# Patient Record
Sex: Female | Born: 1970 | Race: White | Hispanic: No | Marital: Married | Smoking: Light tobacco smoker
Health system: Southern US, Community
[De-identification: ages and names within clinical notes are randomized; demographics above are authoritative.]

## PROBLEM LIST (undated history)

## (undated) DIAGNOSIS — E538 Deficiency of other specified B group vitamins: Secondary | ICD-10-CM

## (undated) DIAGNOSIS — E039 Hypothyroidism, unspecified: Secondary | ICD-10-CM

## (undated) DIAGNOSIS — F419 Anxiety disorder, unspecified: Secondary | ICD-10-CM

## (undated) DIAGNOSIS — E559 Vitamin D deficiency, unspecified: Secondary | ICD-10-CM

## (undated) HISTORY — PX: HERNIA REPAIR: SHX51

---

## 2009-01-12 ENCOUNTER — Ambulatory Visit: Payer: Self-pay

## 2009-10-19 ENCOUNTER — Ambulatory Visit: Payer: Self-pay | Admitting: Internal Medicine

## 2011-12-30 ENCOUNTER — Ambulatory Visit: Payer: Self-pay

## 2011-12-30 LAB — RAPID INFLUENZA A&B ANTIGENS

## 2012-06-25 ENCOUNTER — Ambulatory Visit: Payer: Self-pay | Admitting: Emergency Medicine

## 2012-10-22 ENCOUNTER — Ambulatory Visit: Payer: Self-pay | Admitting: Medical

## 2013-02-10 ENCOUNTER — Ambulatory Visit: Payer: Self-pay | Admitting: Family Medicine

## 2013-08-19 DIAGNOSIS — Z9109 Other allergy status, other than to drugs and biological substances: Secondary | ICD-10-CM | POA: Insufficient documentation

## 2013-08-26 ENCOUNTER — Emergency Department: Payer: Self-pay | Admitting: Emergency Medicine

## 2013-09-21 ENCOUNTER — Ambulatory Visit: Payer: Self-pay

## 2014-03-06 ENCOUNTER — Ambulatory Visit: Payer: Self-pay | Admitting: Emergency Medicine

## 2014-03-06 LAB — RAPID STREP-A WITH REFLX: Micro Text Report: POSITIVE

## 2014-10-30 ENCOUNTER — Ambulatory Visit: Payer: Self-pay | Admitting: Physician Assistant

## 2015-12-29 DIAGNOSIS — E789 Disorder of lipoprotein metabolism, unspecified: Secondary | ICD-10-CM | POA: Insufficient documentation

## 2017-08-30 ENCOUNTER — Other Ambulatory Visit: Payer: Self-pay | Admitting: Obstetrics & Gynecology

## 2017-08-30 DIAGNOSIS — Z1231 Encounter for screening mammogram for malignant neoplasm of breast: Secondary | ICD-10-CM

## 2017-09-13 ENCOUNTER — Ambulatory Visit
Admission: RE | Admit: 2017-09-13 | Discharge: 2017-09-13 | Disposition: A | Discharge status: Home / Self Care | Payer: Managed Care, Other (non HMO) | Source: Ambulatory Visit | Attending: Obstetrics & Gynecology | Admitting: Obstetrics & Gynecology

## 2017-09-13 ENCOUNTER — Encounter: Payer: Self-pay | Admitting: Radiology

## 2017-09-13 DIAGNOSIS — Z1231 Encounter for screening mammogram for malignant neoplasm of breast: Secondary | ICD-10-CM | POA: Diagnosis present

## 2017-09-14 ENCOUNTER — Other Ambulatory Visit: Payer: Self-pay | Admitting: *Deleted

## 2017-09-14 ENCOUNTER — Inpatient Hospital Stay
Admission: RE | Admit: 2017-09-14 | Discharge: 2017-09-14 | Disposition: A | Discharge status: Home / Self Care | Payer: Self-pay | Source: Ambulatory Visit | Attending: *Deleted | Admitting: *Deleted

## 2017-09-14 DIAGNOSIS — Z9289 Personal history of other medical treatment: Secondary | ICD-10-CM

## 2019-02-01 ENCOUNTER — Ambulatory Visit: Payer: Managed Care, Other (non HMO) | Admitting: Podiatry

## 2019-02-08 ENCOUNTER — Encounter: Payer: Self-pay | Admitting: Podiatry

## 2019-02-08 ENCOUNTER — Ambulatory Visit: Payer: Managed Care, Other (non HMO) | Admitting: Podiatry

## 2019-02-08 ENCOUNTER — Ambulatory Visit (INDEPENDENT_AMBULATORY_CARE_PROVIDER_SITE_OTHER): Payer: Managed Care, Other (non HMO)

## 2019-02-08 VITALS — BP 113/69 | HR 80

## 2019-02-08 DIAGNOSIS — M21612 Bunion of left foot: Secondary | ICD-10-CM

## 2019-02-08 DIAGNOSIS — M79672 Pain in left foot: Secondary | ICD-10-CM

## 2019-02-08 DIAGNOSIS — M21611 Bunion of right foot: Secondary | ICD-10-CM | POA: Diagnosis not present

## 2019-02-08 DIAGNOSIS — M79671 Pain in right foot: Secondary | ICD-10-CM

## 2019-02-08 DIAGNOSIS — M2011 Hallux valgus (acquired), right foot: Secondary | ICD-10-CM | POA: Diagnosis not present

## 2019-02-08 DIAGNOSIS — M2010 Hallux valgus (acquired), unspecified foot: Secondary | ICD-10-CM

## 2019-02-08 NOTE — Patient Instructions (Signed)
Pre-Operative Instructions  Congratulations, you have decided to take an important step towards improving your quality of life.  You can be assured that the doctors and staff at [facility] will be with you every step of the way.  Here are some important things you should know:  1. Plan to be at the surgery center/hospital at least 1 (one) hour prior to your scheduled time, unless otherwise directed by the surgical center/hospital staff.  You must have a responsible adult accompany you, remain during the surgery and drive you home.  Make sure you have directions to the surgical center/hospital to ensure you arrive on time. 2. If you are having surgery at [facility] or [facility], you will need a copy of your medical history and physical form from your family physician within one month prior to the date of surgery. We will give you a form for your primary physician to complete.  3. We make every effort to accommodate the date you request for surgery.  However, there are times where surgery dates or times have to be moved.  We will contact you as soon as possible if a change in schedule is required.   4. No aspirin/ibuprofen for one week before surgery.  If you are on aspirin, any non-steroidal anti-inflammatory medications (Mobic, Aleve, Ibuprofen) should not be taken seven (7) days prior to your surgery.  You make take Tylenol for pain prior to surgery.  5. Medications - If you are taking daily heart and blood pressure medications, seizure, reflux, allergy, asthma, anxiety, pain or diabetes medications, make sure you notify the surgery center/hospital before the day of surgery so they can tell you which medications you should take or avoid the day of surgery. 6. No food or drink after midnight the night before surgery unless directed otherwise by surgical center/hospital staff. 7. No alcoholic beverages 24-hours prior to surgery.  No smoking 24-hours prior or 24-hours after  surgery. 8. Wear loose pants or shorts. They should be loose enough to fit over bandages, boots, and casts. 9. Don't wear slip-on shoes. Sneakers are preferred. 10. Bring your boot with you to the surgery center/hospital.  Also bring crutches or a walker if your physician has prescribed it for you.  If you do not have this equipment, it will be provided for you after surgery. 11. If you have not been contacted by the surgery center/hospital by the day before your surgery, call to confirm the date and time of your surgery. 12. Leave-time from work may vary depending on the type of surgery you have.  Appropriate arrangements should be made prior to surgery with your employer. 13. Prescriptions will be provided immediately following surgery by your doctor.  Fill these as soon as possible after surgery and take the medication as directed. Pain medications will not be refilled on weekends and must be approved by the doctor. 14. Remove nail polish on the operative foot and avoid getting pedicures prior to surgery. 15. Wash the night before surgery.  The night before surgery wash the foot and leg well with water and the antibacterial soap provided. Be sure to pay special attention to beneath the toenails and in between the toes.  Wash for at least three (3) minutes. Rinse thoroughly with water and dry well with a towel.  Perform this wash unless told not to do so by your physician.  Enclosed: 1 Ice pack (please put in freezer the night before surgery)   1 Hibiclens skin cleaner     Pre-op instructions  If you have any questions regarding the instructions, please do not hesitate to call our office.  [place]: [address], [place], [place] [address] -- [phone]  [place]: [address] [place], [place] [address] -- [phone]  [place]: [address]  [place], [place] [address] -- [phone]  [place]: [address], [address], [place], [place] [address] -- [phone]  Website:  [email or web address] 

## 2019-02-11 NOTE — Progress Notes (Signed)
   Subjective: 48 year old female presenting today as a new patient with a chief complaint of painful bunions noted to bilateral feet that have been present for the past several years. She states the pain has been gradually worsening over the past few months. Walking and wearing shoes increases the pain. She has been taking OTC pain medications with no significant relief. Patient is here for further evaluation and treatment.  History reviewed. No pertinent past medical history.    Objective: Physical Exam General: The patient is alert and oriented x3 in no acute distress.  Dermatology: Skin is cool, dry and supple bilateral lower extremities. Negative for open lesions or macerations.  Vascular: Palpable pedal pulses bilaterally. No edema or erythema noted. Capillary refill within normal limits.  Neurological: Epicritic and protective threshold grossly intact bilaterally.   Musculoskeletal Exam: Clinical evidence of bunion deformity noted to the respective foot. There is moderate pain on palpation range of motion of the first MPJ. Lateral deviation of the hallux noted consistent with hallux abductovalgus.  Radiographic Exam: Increased intermetatarsal angle greater than 15 with a hallux abductus angle greater than 30 noted on AP view. Moderate degenerative changes noted within the first MPJ.  Assessment: 1. HAV w/ bunion deformity bilateral     Plan of Care:  1. Patient was evaluated. X-Rays reviewed. 2. Today we discussed the conservative versus surgical management of the presenting pathology. The patient opts for surgical management. All possible complications and details of the procedure were explained. All patient questions were answered. No guarantees were expressed or implied. 3. Authorization for surgery was initiated today. Surgery will consist of bunionectomy with 1st metatarsal osteotomy left.  4. Return to clinic one week post op.   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      Edrick Kins, DPM [facility]  Dr. Edrick Kins, [place]                                        [place], [place] [address]                Office [phone]  Fax [phone]

## 2019-02-25 ENCOUNTER — Other Ambulatory Visit: Payer: Self-pay

## 2019-02-25 ENCOUNTER — Ambulatory Visit
Admission: EM | Admit: 2019-02-25 | Discharge: 2019-02-25 | Disposition: A | Discharge status: Home / Self Care | Payer: Managed Care, Other (non HMO) | Attending: Family Medicine | Admitting: Family Medicine

## 2019-02-25 ENCOUNTER — Encounter: Payer: Self-pay | Admitting: Emergency Medicine

## 2019-02-25 DIAGNOSIS — J111 Influenza due to unidentified influenza virus with other respiratory manifestations: Secondary | ICD-10-CM

## 2019-02-25 DIAGNOSIS — R509 Fever, unspecified: Secondary | ICD-10-CM | POA: Diagnosis not present

## 2019-02-25 DIAGNOSIS — R05 Cough: Secondary | ICD-10-CM

## 2019-02-25 DIAGNOSIS — F17210 Nicotine dependence, cigarettes, uncomplicated: Secondary | ICD-10-CM

## 2019-02-25 DIAGNOSIS — R0981 Nasal congestion: Secondary | ICD-10-CM

## 2019-02-25 DIAGNOSIS — R0602 Shortness of breath: Secondary | ICD-10-CM

## 2019-02-25 HISTORY — DX: Vitamin D deficiency, unspecified: E55.9

## 2019-02-25 HISTORY — DX: Deficiency of other specified B group vitamins: E53.8

## 2019-02-25 HISTORY — DX: Anxiety disorder, unspecified: F41.9

## 2019-02-25 MED ORDER — OSELTAMIVIR PHOSPHATE 75 MG PO CAPS: 75.0000 mg | ORAL_CAPSULE | Freq: Two times a day (BID) | ORAL | 0 refills | Status: DC

## 2019-02-25 MED ORDER — HYDROCOD POLST-CPM POLST ER 10-8 MG/5ML PO SUER: 5.0000 mL | Freq: Two times a day (BID) | ORAL | 0 refills | Status: DC | PRN

## 2019-02-25 MED ORDER — ALBUTEROL SULFATE HFA 108 (90 BASE) MCG/ACT IN AERS: 1.0000 | INHALATION_SPRAY | Freq: Four times a day (QID) | RESPIRATORY_TRACT | 0 refills | Status: DC | PRN

## 2019-02-25 NOTE — Discharge Instructions (Signed)
Rest. Fluids.  Use the inhaler for cough and SOB.  Tussionex for cough.  Tamiflu as directed.  Take care  Dr. Lacinda Axon

## 2019-02-25 NOTE — ED Triage Notes (Signed)
Patient in today c/o cough, body aches, and has felt feverish since yesterday. Patient has been taking Ibuprofen and Mucinex.

## 2019-02-25 NOTE — ED Provider Notes (Signed)
[facility]    CSN: [id number] Arrival date & time: 02/25/19  5643  History   Chief Complaint Chief Complaint  Patient presents with  . Cough  . Generalized Body Aches   HPI  48 year old female presents with fever, cough, congestion.  Started abruptly yesterday.  Patient states that she has felt fevers.  She states that she had "sweats" last night.  She has not taken her temperature.  She reports chest tightness, cough, and nasal congestion/sinus congestion.  Patient reports associated body aches as well.  Symptoms are severe.  She has been using Mucinex, vitamin C, and Robitussin without resolution.  Has not really helped any of her symptoms.  No known exacerbating factors.  Patient does report that her symptoms are disrupting her sleep.  No other associated symptoms.  No complaints.  PMH, Surgical Hx, Family Hx, Social History reviewed and updated as below.  Past Medical History:  Diagnosis Date  . Anxiety   . Vitamin B12 deficiency   . Vitamin D deficiency     Patient Active Problem List   Diagnosis Date Noted  . Borderline high cholesterol 12/29/2015  . Environmental allergies 08/19/2013    Past Surgical History:  Procedure Laterality Date  . CESAREAN SECTION    . HERNIA REPAIR      OB History   No obstetric history on file.      Home Medications    Prior to Admission medications   Medication Sig Start Date End Date Taking? Authorizing Provider  Cyanocobalamin (VITAMIN B12 PO) Take by mouth.   Yes [provider]  escitalopram (LEXAPRO) 10 MG tablet Take by mouth. 10/04/18  Yes [provider]  levonorgestrel (MIRENA) 20 MCG/24HR IUD by Intrauterine route.   Yes [provider]  Multiple Vitamin (MULTI-VITAMINS) TABS Take by mouth.   Yes [provider]  Vitamin D, Ergocalciferol, (DRISDOL) 1.25 MG (50000 UT) CAPS capsule Take by mouth. 09/04/17  Yes [provider]  albuterol (PROVENTIL HFA;VENTOLIN  HFA) 108 (90 Base) MCG/ACT inhaler Inhale 1-2 puffs into the lungs every 6 (six) hours as needed for wheezing or shortness of breath. 02/25/19   Coral Spikes, DO  chlorpheniramine-HYDROcodone (TUSSIONEX PENNKINETIC ER) 10-8 MG/5ML SUER Take 5 mLs by mouth every 12 (twelve) hours as needed. 02/25/19   Coral Spikes, DO  oseltamivir (TAMIFLU) 75 MG capsule Take 1 capsule (75 mg total) by mouth every 12 (twelve) hours. 02/25/19   Coral Spikes, DO    Family History Family History  Problem Relation Age of Onset  . Breast cancer Mother 41  . Hyperlipidemia Mother   . Heart attack Father 58  . COPD Father   . Hypertension Father   . Hyperlipidemia Father     Social History Social History   Tobacco Use  . Smoking status: Light Tobacco Smoker  . Smokeless tobacco: Never Used  . Tobacco comment: smokes socially  Substance Use Topics  . Alcohol use: Yes    Comment: social  . Drug use: Never     Allergies   Sulfa antibiotics   Review of Systems Review of Systems  Constitutional: Positive for fever.  HENT: Positive for congestion.   Respiratory: Positive for cough and shortness of breath.   Musculoskeletal:       Body aches.   Physical Exam Triage Vital Signs ED Triage Vitals [02/25/19 0857]  Enc Vitals Group     BP 140/88     Pulse Rate 93     Resp  16     Temp 99.4 F (37.4 C)     Temp Source Oral     SpO2 98 %     Weight 188 lb (85.3 kg)     Height 5' (1.524 m)     Head Circumference      Peak Flow      Pain Score 7     Pain Loc      Pain Edu?      Excl. in [place]?    Updated Vital Signs BP 140/88 (BP Location: Left Arm)   Pulse 93   Temp 99.4 F (37.4 C) (Oral)   Resp 16   Ht 5' (1.524 m)   Wt 85.3 kg   SpO2 98%   BMI 36.72 kg/m   Visual Acuity Right Eye Distance:   Left Eye Distance:   Bilateral Distance:    Right Eye Near:   Left Eye Near:    Bilateral Near:     Physical Exam Vitals signs and nursing note reviewed.  Constitutional:      General:  She is not in acute distress.    Appearance: Normal appearance.  HENT:     Head: Normocephalic and atraumatic.     Right Ear: Tympanic membrane normal.     Left Ear: Tympanic membrane normal.     Mouth/Throat:     Pharynx: Oropharynx is clear. No oropharyngeal exudate.  Eyes:     General:        Right eye: No discharge.        Left eye: No discharge.     Conjunctiva/sclera: Conjunctivae normal.  Cardiovascular:     Rate and Rhythm: Normal rate and regular rhythm.  Pulmonary:     Effort: Pulmonary effort is normal.     Breath sounds: Normal breath sounds.  Neurological:     Mental Status: She is alert.  Psychiatric:        Mood and Affect: Mood normal.        Behavior: Behavior normal.    UC Treatments / Results  Labs (all labs ordered are listed, but only abnormal results are displayed) Labs Reviewed - No data to display  EKG None  Radiology No results found.  Procedures Procedures (including critical care time)  Medications Ordered in UC Medications - No data to display  Initial Impression / Assessment and Plan / UC Course  I have reviewed the triage vital signs and the nursing notes.  Pertinent labs & imaging results that were available during my care of the patient were reviewed by me and considered in my medical decision making (see chart for details).    48 year old female presents with suspected influenza. Treating with Tamiflu.  Albuterol and Tussionex for cough/shortness of breath.  Final Clinical Impressions(s) / UC Diagnoses   Final diagnoses:  Influenza     Discharge Instructions     Rest. Fluids.  Use the inhaler for cough and SOB.  Tussionex for cough.  Tamiflu as directed.  Take care  Dr. Lacinda Axon    ED Prescriptions    Medication Sig Dispense Auth. Provider   albuterol (PROVENTIL HFA;VENTOLIN HFA) 108 (90 Base) MCG/ACT inhaler Inhale 1-2 puffs into the lungs every 6 (six) hours as needed for wheezing or shortness of breath. 1  Inhaler Manzanita, [place] G, DO   chlorpheniramine-HYDROcodone (TUSSIONEX PENNKINETIC ER) 10-8 MG/5ML SUER Take 5 mLs by mouth every 12 (twelve) hours as needed. 60 mL Birl Lobello G, DO   oseltamivir (TAMIFLU) 75 MG  capsule Take 1 capsule (75 mg total) by mouth every 12 (twelve) hours. 10 capsule Coral Spikes, DO     Controlled Substance Prescriptions [place] Controlled Substance Registry consulted? Not Applicable   Coral Spikes, [place] 02/25/19 4090

## 2019-03-19 ENCOUNTER — Other Ambulatory Visit: Payer: Self-pay | Admitting: Family Medicine

## 2019-06-17 ENCOUNTER — Other Ambulatory Visit: Payer: Self-pay | Admitting: Obstetrics & Gynecology

## 2019-06-17 DIAGNOSIS — K439 Ventral hernia without obstruction or gangrene: Secondary | ICD-10-CM

## 2019-06-17 DIAGNOSIS — Z30432 Encounter for removal of intrauterine contraceptive device: Secondary | ICD-10-CM

## 2019-06-25 ENCOUNTER — Ambulatory Visit
Admission: RE | Admit: 2019-06-25 | Discharge: 2019-06-25 | Disposition: A | Discharge status: Home / Self Care | Payer: Managed Care, Other (non HMO) | Source: Ambulatory Visit | Attending: Obstetrics & Gynecology | Admitting: Obstetrics & Gynecology

## 2019-06-25 ENCOUNTER — Other Ambulatory Visit: Payer: Self-pay

## 2019-06-25 DIAGNOSIS — Z30432 Encounter for removal of intrauterine contraceptive device: Secondary | ICD-10-CM | POA: Insufficient documentation

## 2019-06-25 DIAGNOSIS — K439 Ventral hernia without obstruction or gangrene: Secondary | ICD-10-CM | POA: Diagnosis not present

## 2019-06-25 MED ORDER — IOHEXOL 300 MG/ML  SOLN
100.0000 mL | Freq: Once | INTRAMUSCULAR | Status: AC | PRN
  Administered 2019-06-25: 100 mL via INTRAVENOUS

## 2020-04-23 ENCOUNTER — Ambulatory Visit: Payer: Managed Care, Other (non HMO) | Admitting: Dermatology

## 2020-06-23 ENCOUNTER — Other Ambulatory Visit: Payer: Self-pay | Admitting: Obstetrics & Gynecology

## 2020-06-23 DIAGNOSIS — Z1231 Encounter for screening mammogram for malignant neoplasm of breast: Secondary | ICD-10-CM

## 2020-06-30 ENCOUNTER — Other Ambulatory Visit: Payer: Self-pay

## 2020-06-30 ENCOUNTER — Ambulatory Visit
Admission: RE | Admit: 2020-06-30 | Discharge: 2020-06-30 | Disposition: A | Discharge status: Home / Self Care | Payer: Managed Care, Other (non HMO) | Source: Ambulatory Visit | Attending: Obstetrics & Gynecology | Admitting: Obstetrics & Gynecology

## 2020-06-30 DIAGNOSIS — Z1231 Encounter for screening mammogram for malignant neoplasm of breast: Secondary | ICD-10-CM | POA: Insufficient documentation

## 2020-07-10 ENCOUNTER — Ambulatory Visit: Payer: Self-pay | Admitting: General Surgery

## 2020-07-10 NOTE — H&P (View-Only) (Signed)
PATIENT PROFILE: Betty Poole is a 49 y.o. female who presents to the Clinic for consultation at the request of Dr. Ola Spurr for evaluation of ventral hernia.  PCP:  Angelena Form, MD  HISTORY OF PRESENT ILLNESS: Betty Poole reports having a hernia for at least a year ago.  She reported that it is getting aggravating and causing pain on the midline of the abdomen.  The pain does not radiate to other part of the body.  The pain is aggravated by doing exercises like abdominal exercises.  There is no alleviating factors.  The patient reports some nausea but no vomiting.  She reports some abdominal distention after eating.  She reports that sometimes it gets as big and hard as a soft ball.  There are other days that it is soft and nontender like today.  Patient reports having umbilical hernia repair in the past.  Patient had a CT scan of the abdomen and pelvis at the [facility] epic.  I personally evaluated the images.  There is ventral hernia within diastases recti.  Hernia content during that CT was omental fat.  No bowel seen on the hernia during CT scan.   PROBLEM LIST:        Problem List  Date Reviewed: 11/27/2017       Noted   Borderline high cholesterol 12/29/2015      GENERAL REVIEW OF SYSTEMS:   General ROS: negative for - chills, fatigue, fever, weight gain or weight loss Allergy and Immunology ROS: negative for - hives  Hematological and Lymphatic ROS: negative for - bleeding problems or bruising, negative for palpable nodes Endocrine ROS: negative for - heat or cold intolerance, hair changes Respiratory ROS: negative for - cough, shortness of breath or wheezing Cardiovascular ROS: no chest pain or palpitations GI ROS: negative for nausea, vomiting, abdominal pain, diarrhea, constipation Musculoskeletal ROS: negative for - joint swelling or muscle pain Neurological ROS: negative for - confusion, syncope Dermatological ROS: negative for pruritus and  rash Psychiatric: negative for anxiety, depression, difficulty sleeping and memory loss  MEDICATIONS: Current Medications        Current Outpatient Medications  Medication Sig Dispense Refill  . ergocalciferol, vitamin D2, 1,250 mcg (50,000 unit) capsule Take 1 capsule (50,000 Units total) by mouth once a week 12 capsule 1  . escitalopram oxalate (LEXAPRO) 10 MG tablet Take 1 tablet (10 mg total) by mouth once daily NEEDS APPT. PRIOR TO FURTHER REFILLS 30 tablet 1  . fluticasone propionate (FLONASE) 50 mcg/actuation nasal spray Place 2 sprays into both nostrils once daily 16 g 0  . levothyroxine (SYNTHROID) 25 MCG tablet Take 1 tablet (25 mcg total) by mouth once daily Take on an empty stomach with a glass of water at least 30-60 minutes before breakfast. 30 tablet 3  . LORazepam (ATIVAN) 0.5 MG tablet Take 0.5 mg by mouth every 8 (eight) hours as needed for Anxiety     No current facility-administered medications for this visit.      ALLERGIES: Aspirin and Sulfa (sulfonamide antibiotics)  PAST MEDICAL HISTORY:     Past Medical History:  Diagnosis Date  . Anxiety   . Borderline high cholesterol 12/29/2015  . Heart murmur, unspecified At birth only  . Hypertension in pregnancy, preeclampsia, delivered    Only high with pregnancy  . Hypothyroidism (acquired), unspecified 07/2017   started on synthroid 25 mcg  . Vitamin B12 deficiency   . Vitamin D deficiency disease 08/2017    PAST SURGICAL HISTORY:  Past Surgical History:  Procedure Laterality Date  . CESAREAN SECTION     Delivered twins  . hernia repair  2017   Double hernia repair  . HERNIA REPAIR       FAMILY HISTORY:      Family History  Problem Relation Age of Onset  . Alcohol abuse Mother   . Breast cancer Mother   . Depression Mother   . Coronary Artery Disease (Blocked arteries around heart) Father   . High blood pressure (Hypertension) Father   . Hyperlipidemia (Elevated  cholesterol) Father   . Myocardial Infarction (Heart attack) Father   . Heart disease Father   . Alzheimer's disease Maternal Grandmother   . Heart failure Maternal Grandmother   . Stroke Paternal Grandmother   . High blood pressure (Hypertension) Paternal Grandmother   . Coronary Artery Disease (Blocked arteries around heart) Paternal Grandmother   . Coronary Artery Disease (Blocked arteries around heart) Paternal Grandfather   . High blood pressure (Hypertension) Paternal Grandfather   . Stroke Paternal Grandfather   . Brain cancer Paternal Grandfather   . Asthma Daughter   . High blood pressure (Hypertension) Brother   . Heart disease Brother      SOCIAL HISTORY: Social History          Socioeconomic History  . Marital status: Married    Spouse name: Maddilyn Campus  . Number of children: 2  . Years of education: 10  . Highest education level: Associate degree: academic program  Occupational History  . Occupation: Chief Strategy Officer    Comment: [employer]  Tobacco Use  . Smoking status: Current Every Day Smoker    Packs/day: 0.25    Years: 0.00    Pack years: 0.00    Types: Cigarettes    Start date: 12/29/1995  . Smokeless tobacco: Never Used  Vaping Use  . Vaping Use: Never used  Substance and Sexual Activity  . Alcohol use: Yes  . Drug use: Never  . Sexual activity: Yes    Partners: Male    Birth control/protection: None  Other Topics Concern  . 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  Lives with her husband and twin daughters.   Social Determinants of Health      Financial Resource Strain:   . Difficulty of Paying Living Expenses:   Food Insecurity:   . Worried About Charity fundraiser in the Last Year:   . Arboriculturist in the Last Year:   Transportation Needs:   . Film/video editor (Medical):   Marland Kitchen Lack of Transportation (Non-Medical):        PHYSICAL EXAM:    Vitals:   07/10/20 0846  BP: 123/79  Pulse: 86   Body mass index is 37.57 kg/m. Weight: 84.4 kg (186 lb)   GENERAL: Alert, active, oriented x3  HEENT: Pupils equal reactive to light. Extraocular movements are intact. Sclera clear. Palpebral conjunctiva normal red color.Pharynx clear.  NECK: Supple with no palpable mass and no adenopathy.  LUNGS: Sound clear with no rales rhonchi or wheezes.  HEART: Regular rhythm S1 and S2 without murmur.  ABDOMEN: Soft and depressible, nontender with no palpable mass, no hepatomegaly.  Reducible small hernia in the midline.  Large diastases recti.  Nontender to palpation today  EXTREMITIES: Well-developed well-nourished symmetrical with no dependent edema.  NEUROLOGICAL: Awake alert oriented, facial expression symmetrical, moving all extremities.  REVIEW OF DATA: I have reviewed  the following data today:      Appointment on 06/26/2020  Component Date Value  . Cholesterol, Total 06/26/2020 293*  . Triglyceride 06/26/2020 372*  . HDL (High Density Lipopr* 06/26/2020 46.0   . LDL Calculated 06/26/2020 173*  . VLDL Cholesterol 06/26/2020 74   . Cholesterol/HDL Ratio 06/26/2020 6.4   . Vitamin D, 25-Hydroxy - * 06/26/2020 36.0   Office Visit on 06/22/2020  Component Date Value  . WBC (White Blood Cell Co* 06/23/2020 8.7   . RBC (Red Blood Cell Coun* 06/23/2020 4.92   . Hemoglobin 06/23/2020 14.1   . Hematocrit 06/23/2020 43.2   . MCV (Mean Corpuscular Vo* 06/23/2020 87.8   . MCH (Mean Corpuscular He* 06/23/2020 28.7   . MCHC (Mean Corpuscular H* 06/23/2020 32.6   . Platelet Count 06/23/2020 282   . RDW-CV (Red Cell Distrib* 06/23/2020 14.5   . MPV (Mean Platelet Volum* 06/23/2020 10.7   . Neutrophils 06/23/2020 4.73   . Lymphocytes 06/23/2020 3.16   . Monocytes 06/23/2020 0.54   . Eosinophils 06/23/2020 0.21   . Basophils 06/23/2020 0.05   . Neutrophil % 06/23/2020 54.3   . Lymphocyte %  06/23/2020 36.2   . Monocyte % 06/23/2020 6.2   . Eosinophil % 06/23/2020 2.4   . Basophil% 06/23/2020 0.6   . Immature Granulocyte % 06/23/2020 0.3   . Immature Granulocyte Cou* 06/23/2020 0.03   . Glucose 06/23/2020 81   . Sodium 06/23/2020 133*  . Potassium 06/23/2020 4.7   . Chloride 06/23/2020 101   . Carbon Dioxide (CO2) 06/23/2020 25.4   . Urea Nitrogen (BUN) 06/23/2020 16   . Creatinine 06/23/2020 0.8   . Glomerular Filtration Ra* 06/23/2020 77   . Calcium 06/23/2020 9.1   . AST  06/23/2020 14   . ALT  06/23/2020 17   . Alk Phos (alkaline Phosp* 06/23/2020 73   . Albumin 06/23/2020 4.0   . Bilirubin, Total 06/23/2020 0.3   . Protein, Total 06/23/2020 7.2   . A/G Ratio 06/23/2020 1.3   . Cholesterol, Total 06/23/2020 346*  . Triglyceride 06/23/2020 1,955*  . HDL (High Density Lipopr* 06/23/2020 34.9*  . Cholesterol/HDL Ratio 06/23/2020 9.9   . Thyroid Stimulating Horm* 06/23/2020 5.741*  . Vitamin B12 06/23/2020 571   . Vitamin D, 25-Hydroxy - * 06/23/2020 TNP   . DIAGNOSIS: - [facility] 06/22/2020 Comment   . Specimen adequacy: - Lab* 06/22/2020 Comment   . Clinician provided ICD10* 06/22/2020 Comment   . PERFORMED BY: - [facility] 06/22/2020 Comment   . . - [facility] 06/22/2020 .   Marland Kitchen Note: - [facility] 06/22/2020 Comment   . Test Methodology: - [facility] 06/22/2020 Comment   . HPV APTIMA - [facility] 06/22/2020 Negative      ASSESSMENT: Betty Poole is a 49 y.o. female presenting for consultation for ventral hernia.  Patient oriented about the diagnosis of ventral hernia and diastases recti.  The patient reported that the hernia is getting worse within the last months causing her more pain and unable to do some exercises.  She has been losing significant weight since she has been doing exercises.  She is also doing diet.  We have a discussion about proceeding with surgical management of hernia or waiting to continue losing weight.  She reported that he has been getting worse in the  last month so she will not think that she will be able to continue exercising as she used to.  I agree that with the symptoms it is reasonable to  proceed with surgical management at this moment.  Patient understand the high risk of recurrence with obesity.  I discussed with the patient the alternative of robotic assisted laparoscopic ventral hernia repair.  That way I will be able to examine the whole abdominal wall.  I discussed with the patient the alternative of diastases recti plication in combination with the ventral hernia.  I discussed with the patient the risks of surgery that includes: Bleeding, infection, pain, scar, injury to intestine or adjacent organs, complication of mesh, among others.  She understood and agreed to proceed.  Ventral hernia without obstruction or gangrene [K43.9]  PLAN: 1. Robotic assisted laparoscopic ventral hernia repair with mesh (09326) 2. CBC, CMP done (06/23/20) 3. Avoid taking aspirin 5 days before surgery 4. Contact us if has any question or concern.   Patient and her husband verbalized understanding, all questions were answered, and were agreeable with the plan outlined above.     Herbert Pun, MD  Electronically signed by Herbert Pun, MD

## 2020-07-10 NOTE — H&P (Signed)
PATIENT PROFILE: Betty Poole is a 49 y.o. female who presents to the Clinic for consultation at the request of Dr. Ola Spurr for evaluation of ventral hernia.  PCP:  Angelena Form, MD  HISTORY OF PRESENT ILLNESS: [name] reports having a hernia for at least a year ago.  She reported that it is getting aggravating and causing pain on the midline of the abdomen.  The pain does not radiate to other part of the body.  The pain is aggravated by doing exercises like abdominal exercises.  There is no alleviating factors.  The patient reports some nausea but no vomiting.  She reports some abdominal distention after eating.  She reports that sometimes it gets as big and hard as a soft ball.  There are other days that it is soft and nontender like today.  Patient reports having umbilical hernia repair in the past.  Patient had a CT scan of the abdomen and pelvis at the [facility] epic.  I personally evaluated the images.  There is ventral hernia within diastases recti.  Hernia content during that CT was omental fat.  No bowel seen on the hernia during CT scan.   PROBLEM LIST:        Problem List  Date Reviewed: 11/27/2017       Noted   Borderline high cholesterol 12/29/2015      GENERAL REVIEW OF SYSTEMS:   General ROS: negative for - chills, fatigue, fever, weight gain or weight loss Allergy and Immunology ROS: negative for - hives  Hematological and Lymphatic ROS: negative for - bleeding problems or bruising, negative for palpable nodes Endocrine ROS: negative for - heat or cold intolerance, hair changes Respiratory ROS: negative for - cough, shortness of breath or wheezing Cardiovascular ROS: no chest pain or palpitations GI ROS: negative for nausea, vomiting, abdominal pain, diarrhea, constipation Musculoskeletal ROS: negative for - joint swelling or muscle pain Neurological ROS: negative for - confusion, syncope Dermatological ROS: negative for pruritus and  rash Psychiatric: negative for anxiety, depression, difficulty sleeping and memory loss  MEDICATIONS: Current Medications        Current Outpatient Medications  Medication Sig Dispense Refill  . ergocalciferol, vitamin D2, 1,250 mcg (50,000 unit) capsule Take 1 capsule (50,000 Units total) by mouth once a week 12 capsule 1  . escitalopram oxalate (LEXAPRO) 10 MG tablet Take 1 tablet (10 mg total) by mouth once daily NEEDS APPT. PRIOR TO FURTHER REFILLS 30 tablet 1  . fluticasone propionate (FLONASE) 50 mcg/actuation nasal spray Place 2 sprays into both nostrils once daily 16 g 0  . levothyroxine (SYNTHROID) 25 MCG tablet Take 1 tablet (25 mcg total) by mouth once daily Take on an empty stomach with a glass of water at least 30-60 minutes before breakfast. 30 tablet 3  . LORazepam (ATIVAN) 0.5 MG tablet Take 0.5 mg by mouth every 8 (eight) hours as needed for Anxiety     No current facility-administered medications for this visit.      ALLERGIES: Aspirin and Sulfa (sulfonamide antibiotics)  PAST MEDICAL HISTORY:     Past Medical History:  Diagnosis Date  . Anxiety   . Borderline high cholesterol 12/29/2015  . Heart murmur, unspecified At birth only  . Hypertension in pregnancy, preeclampsia, delivered    Only high with pregnancy  . Hypothyroidism (acquired), unspecified 07/2017   started on synthroid 25 mcg  . Vitamin B12 deficiency   . Vitamin D deficiency disease 08/2017    PAST SURGICAL HISTORY:  Past Surgical History:  Procedure Laterality Date  . CESAREAN SECTION     Delivered twins  . hernia repair  2017   Double hernia repair  . HERNIA REPAIR       FAMILY HISTORY:      Family History  Problem Relation Age of Onset  . Alcohol abuse Mother   . Breast cancer Mother   . Depression Mother   . Coronary Artery Disease (Blocked arteries around heart) Father   . High blood pressure (Hypertension) Father   . Hyperlipidemia (Elevated  cholesterol) Father   . Myocardial Infarction (Heart attack) Father   . Heart disease Father   . Alzheimer's disease Maternal Grandmother   . Heart failure Maternal Grandmother   . Stroke Paternal Grandmother   . High blood pressure (Hypertension) Paternal Grandmother   . Coronary Artery Disease (Blocked arteries around heart) Paternal Grandmother   . Coronary Artery Disease (Blocked arteries around heart) Paternal Grandfather   . High blood pressure (Hypertension) Paternal Grandfather   . Stroke Paternal Grandfather   . Brain cancer Paternal Grandfather   . Asthma Daughter   . High blood pressure (Hypertension) Brother   . Heart disease Brother      SOCIAL HISTORY: Social History          Socioeconomic History  . Marital status: Married    Spouse name: Zanylah Hardie  . Number of children: 2  . Years of education: 47  . Highest education level: Associate degree: academic program  Occupational History  . Occupation: Chief Strategy Officer    Comment: [employer]  Tobacco Use  . Smoking status: Current Every Day Smoker    Packs/day: 0.25    Years: 0.00    Pack years: 0.00    Types: Cigarettes    Start date: 12/29/1995  . Smokeless tobacco: Never Used  Vaping Use  . Vaping Use: Never used  Substance and Sexual Activity  . Alcohol use: Yes  . Drug use: Never  . Sexual activity: Yes    Partners: Male    Birth control/protection: None  Other Topics Concern  . 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  Lives with her husband and twin daughters.   Social Determinants of Health      Financial Resource Strain:   . Difficulty of Paying Living Expenses:   Food Insecurity:   . Worried About Charity fundraiser in the Last Year:   . Arboriculturist in the Last Year:   Transportation Needs:   . Film/video editor (Medical):   Marland Kitchen Lack of Transportation (Non-Medical):        PHYSICAL EXAM:    Vitals:   07/10/20 0846  BP: 123/79  Pulse: 86   Body mass index is 37.57 kg/m. Weight: 84.4 kg (186 lb)   GENERAL: Alert, active, oriented x3  HEENT: Pupils equal reactive to light. Extraocular movements are intact. Sclera clear. Palpebral conjunctiva normal red color.Pharynx clear.  NECK: Supple with no palpable mass and no adenopathy.  LUNGS: Sound clear with no rales rhonchi or wheezes.  HEART: Regular rhythm S1 and S2 without murmur.  ABDOMEN: Soft and depressible, nontender with no palpable mass, no hepatomegaly.  Reducible small hernia in the midline.  Large diastases recti.  Nontender to palpation today  EXTREMITIES: Well-developed well-nourished symmetrical with no dependent edema.  NEUROLOGICAL: Awake alert oriented, facial expression symmetrical, moving all extremities.  REVIEW OF DATA: I have reviewed  the following data today:      Appointment on 06/26/2020  Component Date Value  . Cholesterol, Total 06/26/2020 293*  . Triglyceride 06/26/2020 372*  . HDL (High Density Lipopr* 07/16/8287 46.0   . LDL Calculated 06/26/2020 337*  . VLDL Cholesterol 06/26/2020 74   . Cholesterol/HDL Ratio 06/26/2020 6.4   . Vitamin D, 25-Hydroxy - * 06/26/2020 36.0   Office Visit on 06/22/2020  Component Date Value  . WBC (White Blood Cell Co* 06/23/2020 8.7   . RBC (Red Blood Cell Coun* 06/23/2020 4.92   . Hemoglobin 06/23/2020 14.1   . Hematocrit 06/23/2020 43.2   . MCV (Mean Corpuscular Vo* 06/23/2020 87.8   . MCH (Mean Corpuscular He* 06/23/2020 28.7   . MCHC (Mean Corpuscular H* 06/23/2020 32.6   . Platelet Count 06/23/2020 282   . RDW-CV (Red Cell Distrib* 06/23/2020 14.5   . MPV (Mean Platelet Volum* 06/23/2020 10.7   . Neutrophils 06/23/2020 4.73   . Lymphocytes 06/23/2020 3.16   . Monocytes 06/23/2020 0.54   . Eosinophils 06/23/2020 0.21   . Basophils 06/23/2020 0.05   . Neutrophil % 06/23/2020 54.3   . Lymphocyte %  06/23/2020 36.2   . Monocyte % 06/23/2020 6.2   . Eosinophil % 06/23/2020 2.4   . Basophil% 06/23/2020 0.6   . Immature Granulocyte % 06/23/2020 0.3   . Immature Granulocyte Cou* 06/23/2020 0.03   . Glucose 06/23/2020 81   . Sodium 06/23/2020 133*  . Potassium 06/23/2020 4.7   . Chloride 06/23/2020 101   . Carbon Dioxide (CO2) 06/23/2020 25.4   . Urea Nitrogen (BUN) 06/23/2020 16   . Creatinine 06/23/2020 0.8   . Glomerular Filtration Ra* 06/23/2020 77   . Calcium 06/23/2020 9.1   . AST  06/23/2020 14   . ALT  06/23/2020 17   . Alk Phos (alkaline Phosp* 06/23/2020 73   . Albumin 06/23/2020 4.0   . Bilirubin, Total 06/23/2020 0.3   . Protein, Total 06/23/2020 7.2   . A/G Ratio 06/23/2020 1.3   . Cholesterol, Total 06/23/2020 346*  . Triglyceride 06/23/2020 1,955*  . HDL (High Density Lipopr* 44/51/4604 34.9*  . Cholesterol/HDL Ratio 06/23/2020 9.9   . Thyroid Stimulating Horm* 06/23/2020 5.741*  . Vitamin B12 06/23/2020 571   . Vitamin D, 25-Hydroxy - * 06/23/2020 TNP   . DIAGNOSIS: - [facility] 06/22/2020 Comment   . Specimen adequacy: - Lab* 06/22/2020 Comment   . Clinician provided ICD10* 06/22/2020 Comment   . PERFORMED BY: - [facility] 06/22/2020 Comment   . . - [facility] 06/22/2020 .   Marland Kitchen Note: - [facility] 06/22/2020 Comment   . Test Methodology: - [facility] 06/22/2020 Comment   . HPV APTIMA - [facility] 06/22/2020 Negative      ASSESSMENT: Ms. Mencer is a 49 y.o. female presenting for consultation for ventral hernia.  Patient oriented about the diagnosis of ventral hernia and diastases recti.  The patient reported that the hernia is getting worse within the last months causing her more pain and unable to do some exercises.  She has been losing significant weight since she has been doing exercises.  She is also doing diet.  We have a discussion about proceeding with surgical management of hernia or waiting to continue losing weight.  She reported that he has been getting worse in the  last month so she will not think that she will be able to continue exercising as she used to.  I agree that with the symptoms it is reasonable to  proceed with surgical management at this moment.  Patient understand the high risk of recurrence with obesity.  I discussed with the patient the alternative of robotic assisted laparoscopic ventral hernia repair.  That way I will be able to examine the whole abdominal wall.  I discussed with the patient the alternative of diastases recti plication in combination with the ventral hernia.  I discussed with the patient the risks of surgery that includes: Bleeding, infection, pain, scar, injury to intestine or adjacent organs, complication of mesh, among others.  She understood and agreed to proceed.  Ventral hernia without obstruction or gangrene [K43.9]  PLAN: 1. Robotic assisted laparoscopic ventral hernia repair with mesh ([id number]) 2. CBC, CMP done (06/23/20) 3. Avoid taking aspirin 5 days before surgery 4. Contact us if has any question or concern.   Patient and her husband verbalized understanding, all questions were answered, and were agreeable with the plan outlined above.     Herbert Pun, MD  Electronically signed by Herbert Pun, MD

## 2020-07-20 ENCOUNTER — Other Ambulatory Visit: Payer: Self-pay

## 2020-07-20 ENCOUNTER — Other Ambulatory Visit
Admission: RE | Admit: 2020-07-20 | Discharge: 2020-07-20 | Disposition: A | Discharge status: Home / Self Care | Payer: Managed Care, Other (non HMO) | Source: Ambulatory Visit | Attending: General Surgery | Admitting: General Surgery

## 2020-07-20 HISTORY — DX: Hypothyroidism, unspecified: E03.9

## 2020-07-20 NOTE — Patient Instructions (Signed)
Your procedure is scheduled on: Friday July 24, 2020. Report to Day Surgery inside [facility] 2nd floor. To find out your arrival time please call [phone] between 1PM - 3PM on Thursday July 23, 2020.  Remember: Instructions that are not followed completely may result in serious medical risk,  up to and including death, or upon the discretion of your surgeon and anesthesiologist your  surgery may need to be rescheduled.     _X__ 1. Do not eat food after midnight the night before your procedure.                 No gum chewing or hard candies. You may drink clear liquids up to 2 hours                 before you are scheduled to arrive for your surgery- DO not drink clear                 liquids within 2 hours of the start of your surgery.                 Clear Liquids include:  water, apple juice without pulp, clear Gatorade, G2 or                  Gatorade Zero (avoid Red/Purple/Blue), Black Coffee or Tea (Do not add                 anything to coffee or tea).  __X__2.  On the morning of surgery brush your teeth with toothpaste and water, you                may rinse your mouth with mouthwash if you wish.  Do not swallow any toothpaste of mouthwash.     _X__ 3.  No Alcohol for 24 hours before or after surgery.   _X__ 4.  Do Not Smoke or use e-cigarettes For 24 Hours Prior to Your Surgery.                 Do not use any chewable tobacco products for at least 6 hours prior to                 Surgery.  _X__  5.  Do not use any recreational drugs (marijuana, cocaine, heroin, ecstacy, MDMA or other)                For at least one week prior to your surgery.  Combination of these drugs with anesthesia                May have life threatening results.  __X__6.  Notify your doctor if there is any change in your medical condition      (cold, fever, infections).     Do not wear jewelry, make-up, hairpins, clips or nail polish. Do not wear lotions, powders, or  perfumes. You may wear deodorant. Do not shave 48 hours prior to surgery. Men may shave face and neck. Do not bring valuables to the hospital.    [facility] is not responsible for any belongings or valuables.  Contacts, dentures or bridgework may not be worn into surgery. Leave your suitcase in the car. After surgery it may be brought to your room. For patients admitted to the hospital, discharge time is determined by your treatment team.   Patients discharged the day of surgery will not be allowed to drive home.   Make arrangements for someone to be with  you for the first 24 hours of your Same Day Discharge.   __X__ Take these medicines the morning of surgery with A SIP OF WATER:    1. levothyroxine (SYNTHROID) 25 MCG   __X__ Use CHG Soap as directed  __X__ Use nasal spray on the day of surgery  fluticasone (FLONASE) 50 MCG/ACT nasal spray  __X__ Stop Anti-inflammatories such as ibuprofen (ADVIL), aleve, aspirin, naproxen and or BC powders.    __X__ Stop supplements until after surgery.    __X__ Do not start any herbal supplements before your surgery.

## 2020-07-22 ENCOUNTER — Other Ambulatory Visit: Payer: Self-pay

## 2020-07-22 ENCOUNTER — Other Ambulatory Visit
Admission: RE | Admit: 2020-07-22 | Discharge: 2020-07-22 | Disposition: A | Discharge status: Home / Self Care | Payer: Managed Care, Other (non HMO) | Source: Ambulatory Visit | Attending: General Surgery | Admitting: General Surgery

## 2020-07-22 DIAGNOSIS — Z01812 Encounter for preprocedural laboratory examination: Secondary | ICD-10-CM | POA: Insufficient documentation

## 2020-07-22 DIAGNOSIS — Z20822 Contact with and (suspected) exposure to covid-19: Secondary | ICD-10-CM | POA: Insufficient documentation

## 2020-07-22 LAB — SARS CORONAVIRUS 2 (TAT 6-24 HRS): SARS Coronavirus 2: NEGATIVE

## 2020-07-24 ENCOUNTER — Other Ambulatory Visit: Payer: Self-pay

## 2020-07-24 ENCOUNTER — Ambulatory Visit: Payer: Managed Care, Other (non HMO) | Admitting: Anesthesiology

## 2020-07-24 ENCOUNTER — Encounter
Admission: RE | Disposition: A | Discharge status: Home / Self Care | Payer: Self-pay | Source: Ambulatory Visit | Attending: General Surgery

## 2020-07-24 ENCOUNTER — Ambulatory Visit
Admission: RE | Admit: 2020-07-24 | Discharge: 2020-07-24 | Disposition: A | Discharge status: Home / Self Care | Payer: Managed Care, Other (non HMO) | Source: Ambulatory Visit | Attending: General Surgery | Admitting: General Surgery

## 2020-07-24 ENCOUNTER — Encounter: Payer: Self-pay | Admitting: General Surgery

## 2020-07-24 DIAGNOSIS — K436 Other and unspecified ventral hernia with obstruction, without gangrene: Secondary | ICD-10-CM | POA: Diagnosis not present

## 2020-07-24 DIAGNOSIS — Z811 Family history of alcohol abuse and dependence: Secondary | ICD-10-CM | POA: Insufficient documentation

## 2020-07-24 DIAGNOSIS — E559 Vitamin D deficiency, unspecified: Secondary | ICD-10-CM | POA: Diagnosis not present

## 2020-07-24 DIAGNOSIS — F17210 Nicotine dependence, cigarettes, uncomplicated: Secondary | ICD-10-CM | POA: Insufficient documentation

## 2020-07-24 DIAGNOSIS — E538 Deficiency of other specified B group vitamins: Secondary | ICD-10-CM | POA: Diagnosis not present

## 2020-07-24 DIAGNOSIS — Z882 Allergy status to sulfonamides status: Secondary | ICD-10-CM | POA: Insufficient documentation

## 2020-07-24 DIAGNOSIS — F419 Anxiety disorder, unspecified: Secondary | ICD-10-CM | POA: Diagnosis not present

## 2020-07-24 DIAGNOSIS — Z8249 Family history of ischemic heart disease and other diseases of the circulatory system: Secondary | ICD-10-CM | POA: Diagnosis not present

## 2020-07-24 DIAGNOSIS — F17200 Nicotine dependence, unspecified, uncomplicated: Secondary | ICD-10-CM | POA: Insufficient documentation

## 2020-07-24 DIAGNOSIS — Z79899 Other long term (current) drug therapy: Secondary | ICD-10-CM | POA: Diagnosis not present

## 2020-07-24 DIAGNOSIS — Z8349 Family history of other endocrine, nutritional and metabolic diseases: Secondary | ICD-10-CM | POA: Diagnosis not present

## 2020-07-24 DIAGNOSIS — Z803 Family history of malignant neoplasm of breast: Secondary | ICD-10-CM | POA: Diagnosis not present

## 2020-07-24 DIAGNOSIS — Z825 Family history of asthma and other chronic lower respiratory diseases: Secondary | ICD-10-CM | POA: Insufficient documentation

## 2020-07-24 DIAGNOSIS — Z808 Family history of malignant neoplasm of other organs or systems: Secondary | ICD-10-CM | POA: Insufficient documentation

## 2020-07-24 DIAGNOSIS — Z8759 Personal history of other complications of pregnancy, childbirth and the puerperium: Secondary | ICD-10-CM | POA: Diagnosis not present

## 2020-07-24 DIAGNOSIS — Z820 Family history of epilepsy and other diseases of the nervous system: Secondary | ICD-10-CM | POA: Insufficient documentation

## 2020-07-24 DIAGNOSIS — E7800 Pure hypercholesterolemia, unspecified: Secondary | ICD-10-CM | POA: Diagnosis not present

## 2020-07-24 DIAGNOSIS — E039 Hypothyroidism, unspecified: Secondary | ICD-10-CM | POA: Diagnosis not present

## 2020-07-24 DIAGNOSIS — Z886 Allergy status to analgesic agent status: Secondary | ICD-10-CM | POA: Insufficient documentation

## 2020-07-24 DIAGNOSIS — K439 Ventral hernia without obstruction or gangrene: Secondary | ICD-10-CM | POA: Diagnosis present

## 2020-07-24 HISTORY — PX: XI ROBOTIC ASSISTED VENTRAL HERNIA: SHX6789

## 2020-07-24 LAB — POCT PREGNANCY, URINE: Preg Test, Ur: NEGATIVE

## 2020-07-24 SURGERY — REPAIR, HERNIA, VENTRAL, ROBOT-ASSISTED
Anesthesia: General | Site: Abdomen

## 2020-07-24 MED ORDER — OXYCODONE HCL 5 MG PO TABS
ORAL_TABLET | ORAL | Status: AC
  Administered 2020-07-24: 5 mg via ORAL
  Filled 2020-07-24: qty 1

## 2020-07-24 MED ORDER — LACTATED RINGERS IV SOLN: INTRAVENOUS | Status: DC

## 2020-07-24 MED ORDER — CHLORHEXIDINE GLUCONATE 0.12 % MT SOLN
15.0000 mL | Freq: Once | OROMUCOSAL | Status: AC
  Administered 2020-07-24: 15 mL via OROMUCOSAL

## 2020-07-24 MED ORDER — OXYCODONE HCL 5 MG/5ML PO SOLN: 5.0000 mg | Freq: Once | ORAL | Status: AC | PRN

## 2020-07-24 MED ORDER — BUPIVACAINE-EPINEPHRINE (PF) 0.25% -1:200000 IJ SOLN
INTRAMUSCULAR | Status: AC
  Filled 2020-07-24: qty 30

## 2020-07-24 MED ORDER — MIDAZOLAM HCL 2 MG/2ML IJ SOLN
INTRAMUSCULAR | Status: DC | PRN
  Administered 2020-07-24 (×2): 1 mg via INTRAVENOUS

## 2020-07-24 MED ORDER — BUPIVACAINE LIPOSOME 1.3 % IJ SUSP
INTRAMUSCULAR | Status: DC | PRN
  Administered 2020-07-24: 20 mL

## 2020-07-24 MED ORDER — FENTANYL CITRATE (PF) 100 MCG/2ML IJ SOLN
INTRAMUSCULAR | Status: DC | PRN
  Administered 2020-07-24: 50 ug via INTRAVENOUS
  Administered 2020-07-24: 25 ug via INTRAVENOUS
  Administered 2020-07-24: 75 ug via INTRAVENOUS
  Administered 2020-07-24: 50 ug via INTRAVENOUS

## 2020-07-24 MED ORDER — ORAL CARE MOUTH RINSE: 15.0000 mL | Freq: Once | OROMUCOSAL | Status: AC

## 2020-07-24 MED ORDER — FAMOTIDINE 20 MG PO TABS
20.0000 mg | ORAL_TABLET | Freq: Once | ORAL | Status: AC
  Administered 2020-07-24: 20 mg via ORAL

## 2020-07-24 MED ORDER — ACETAMINOPHEN 10 MG/ML IV SOLN
INTRAVENOUS | Status: DC | PRN
  Administered 2020-07-24: 1000 mg via INTRAVENOUS

## 2020-07-24 MED ORDER — ROCURONIUM BROMIDE 100 MG/10ML IV SOLN
INTRAVENOUS | Status: DC | PRN
  Administered 2020-07-24: 20 mg via INTRAVENOUS
  Administered 2020-07-24: 40 mg via INTRAVENOUS

## 2020-07-24 MED ORDER — BUPIVACAINE-EPINEPHRINE (PF) 0.25% -1:200000 IJ SOLN
INTRAMUSCULAR | Status: DC | PRN
  Administered 2020-07-24: 30 mL

## 2020-07-24 MED ORDER — DEXAMETHASONE SODIUM PHOSPHATE 10 MG/ML IJ SOLN
INTRAMUSCULAR | Status: DC | PRN
  Administered 2020-07-24: 8 mg via INTRAVENOUS

## 2020-07-24 MED ORDER — SUGAMMADEX SODIUM 200 MG/2ML IV SOLN
INTRAVENOUS | Status: DC | PRN
  Administered 2020-07-24: 200 mg via INTRAVENOUS

## 2020-07-24 MED ORDER — BUPIVACAINE LIPOSOME 1.3 % IJ SUSP
INTRAMUSCULAR | Status: AC
  Filled 2020-07-24: qty 20

## 2020-07-24 MED ORDER — CEFAZOLIN SODIUM-DEXTROSE 2-4 GM/100ML-% IV SOLN
2.0000 g | INTRAVENOUS | Status: AC
  Administered 2020-07-24: 2 g via INTRAVENOUS

## 2020-07-24 MED ORDER — LIDOCAINE HCL (CARDIAC) PF 100 MG/5ML IV SOSY
PREFILLED_SYRINGE | INTRAVENOUS | Status: DC | PRN
  Administered 2020-07-24: 80 mg via INTRAVENOUS

## 2020-07-24 MED ORDER — FENTANYL CITRATE (PF) 100 MCG/2ML IJ SOLN: 25.0000 ug | INTRAMUSCULAR | Status: DC | PRN

## 2020-07-24 MED ORDER — HYDROCODONE-ACETAMINOPHEN 5-325 MG PO TABS: 1.0000 | ORAL_TABLET | ORAL | 0 refills | Status: AC | PRN

## 2020-07-24 MED ORDER — PROPOFOL 10 MG/ML IV BOLUS
INTRAVENOUS | Status: DC | PRN
  Administered 2020-07-24: 160 mg via INTRAVENOUS

## 2020-07-24 MED ORDER — OXYCODONE HCL 5 MG PO TABS: 5.0000 mg | ORAL_TABLET | Freq: Once | ORAL | Status: AC | PRN

## 2020-07-24 SURGICAL SUPPLY — 46 items
ADH SKN CLS APL DERMABOND .7 (GAUZE/BANDAGES/DRESSINGS) ×1
APL PRP STRL LF DISP 70% ISPRP (MISCELLANEOUS) ×1
BLADE SURG SZ11 CARB STEEL (BLADE) ×3 IMPLANT
CANISTER SUCT 1200ML W/VALVE (MISCELLANEOUS) ×3 IMPLANT
CHLORAPREP W/TINT 26 (MISCELLANEOUS) ×3 IMPLANT
COVER TIP SHEARS 8 DVNC (MISCELLANEOUS) ×1 IMPLANT
COVER TIP SHEARS 8MM DA VINCI (MISCELLANEOUS) ×2
COVER WAND RF STERILE (DRAPES) ×6 IMPLANT
DEFOGGER SCOPE WARMER CLEARIFY (MISCELLANEOUS) ×3 IMPLANT
DERMABOND ADVANCED (GAUZE/BANDAGES/DRESSINGS) ×2
DERMABOND ADVANCED .7 DNX12 (GAUZE/BANDAGES/DRESSINGS) ×1 IMPLANT
DRAPE ARM DVNC X/XI (DISPOSABLE) ×3 IMPLANT
DRAPE COLUMN DVNC XI (DISPOSABLE) ×1 IMPLANT
DRAPE DA VINCI XI ARM (DISPOSABLE) ×6
DRAPE DA VINCI XI COLUMN (DISPOSABLE) ×2
ELECT REM PT RETURN 9FT ADLT (ELECTROSURGICAL) ×3
ELECTRODE REM PT RTRN 9FT ADLT (ELECTROSURGICAL) ×1 IMPLANT
GLOVE BIO SURGEON STRL SZ 6.5 (GLOVE) ×4 IMPLANT
GLOVE BIO SURGEONS STRL SZ 6.5 (GLOVE) ×2
GLOVE BIOGEL PI IND STRL 6.5 (GLOVE) ×2 IMPLANT
GLOVE BIOGEL PI INDICATOR 6.5 (GLOVE) ×4
GOWN STRL REUS W/ TWL LRG LVL3 (GOWN DISPOSABLE) ×3 IMPLANT
GOWN STRL REUS W/TWL LRG LVL3 (GOWN DISPOSABLE) ×9
IRRIGATOR SUCT 8 DISP DVNC XI (IRRIGATION / IRRIGATOR) IMPLANT
IRRIGATOR SUCTION 8MM XI DISP (IRRIGATION / IRRIGATOR)
IV NS 1000ML (IV SOLUTION)
IV NS 1000ML BAXH (IV SOLUTION) IMPLANT
KIT PINK PAD W/HEAD ARE REST (MISCELLANEOUS) ×3
KIT PINK PAD W/HEAD ARM REST (MISCELLANEOUS) ×1 IMPLANT
LABEL OR SOLS (LABEL) ×3 IMPLANT
MESH VENT LT ST 11.4CM CRL (Mesh General) ×3 IMPLANT
NEEDLE HYPO 22GX1.5 SAFETY (NEEDLE) ×3 IMPLANT
NEEDLE INSUFFLATION 14GA 120MM (NEEDLE) ×3 IMPLANT
OBTURATOR OPTICAL STANDARD 8MM (TROCAR) ×2
OBTURATOR OPTICAL STND 8 DVNC (TROCAR) ×1
OBTURATOR OPTICALSTD 8 DVNC (TROCAR) ×1 IMPLANT
PACK LAP CHOLECYSTECTOMY (MISCELLANEOUS) ×3 IMPLANT
SEAL CANN UNIV 5-8 DVNC XI (MISCELLANEOUS) ×3 IMPLANT
SEAL XI 5MM-8MM UNIVERSAL (MISCELLANEOUS) ×6
SET TUBE SMOKE EVAC HIGH FLOW (TUBING) ×3 IMPLANT
SOLUTION ELECTROLUBE (MISCELLANEOUS) ×3 IMPLANT
SUT MNCRL AB 4-0 PS2 18 (SUTURE) ×3 IMPLANT
SUT STRATAFIX PDS 30 CT-1 (SUTURE) ×3 IMPLANT
SUT VLOC 90 2/L VL 12 GS22 (SUTURE) ×6 IMPLANT
TAPE TRANSPORE STRL 2 31045 (GAUZE/BANDAGES/DRESSINGS) ×3 IMPLANT
TRAY FOLEY MTR SLVR 16FR STAT (SET/KITS/TRAYS/PACK) IMPLANT

## 2020-07-24 NOTE — Discharge Instructions (Signed)
AMBULATORY SURGERY  DISCHARGE INSTRUCTIONS   1) The drugs that you were given will stay in your system until tomorrow so for the next 24 hours you should not:  A) Drive an automobile B) Make any legal decisions C) Drink any alcoholic beverage   2) You may resume regular meals tomorrow.  Today it is better to start with liquids and gradually work up to solid foods.  You may eat anything you prefer, but it is better to start with liquids, then soup and crackers, and gradually work up to solid foods.   3) Please notify your doctor immediately if you have any unusual bleeding, trouble breathing, redness and pain at the surgery site, drainage, fever, or pain not relieved by medication. 4)   5) Your post-operative visit with Dr.                                     is: Date:                        Time:    Please call to schedule your post-operative visit.  6) Additional Instructions:     Diet: Resume home heart healthy regular diet.   Activity: No heavy lifting >20 pounds (children, pets, laundry, garbage) or strenuous activity until follow-up, but light activity and walking are encouraged. Do not drive or drink alcohol if taking narcotic pain medications.  Wound care: May shower with soapy water and pat dry (do not rub incisions), but no baths or submerging incision underwater until follow-up. (no swimming)   Medications: Resume all home medications. For mild to moderate pain: acetaminophen (Tylenol) or ibuprofen (if no kidney disease). Combining Tylenol with alcohol can substantially increase your risk of causing liver disease. Narcotic pain medications, if prescribed, can be used for severe pain, though may cause nausea, constipation, and drowsiness. Do not combine Tylenol and Norco within a 6 hour period as Norco contains Tylenol. If you do not need the narcotic pain medication, you do not need to fill the prescription.  Call office ([phone]) at any time if any questions,  worsening pain, fevers/chills, bleeding, drainage from incision site, or other concerns.  

## 2020-07-24 NOTE — Anesthesia Preprocedure Evaluation (Addendum)
Anesthesia Evaluation  Patient identified by MRN, date of birth, ID band Patient awake    Reviewed: Allergy & Precautions, H&P , NPO status , Patient's Chart, lab work & pertinent test results  History of Anesthesia Complications Negative for: history of anesthetic complications  Airway Mallampati: III  TM Distance: >3 FB Neck ROM: full    Dental  (+) Chipped, Poor Dentition, Implants   Pulmonary neg shortness of breath, Current Smoker and Patient abstained from smoking.,    Pulmonary exam normal        Cardiovascular Exercise Tolerance: Good (-) angina(-) Past MI and (-) DOE negative cardio ROS Normal cardiovascular exam     Neuro/Psych negative neurological ROS  negative psych ROS   GI/Hepatic negative GI ROS, Neg liver ROS, neg GERD  ,  Endo/Other  Hypothyroidism   Renal/GU      Musculoskeletal   Abdominal   Peds  Hematology negative hematology ROS (+)   Anesthesia Other Findings Past Medical History: No date: Anxiety No date: Hypothyroidism No date: Vitamin B12 deficiency No date: Vitamin D deficiency  Past Surgical History: No date: CESAREAN SECTION No date: HERNIA REPAIR     Reproductive/Obstetrics negative OB ROS                            Anesthesia Physical Anesthesia Plan  ASA: II  Anesthesia Plan: General ETT   Post-op Pain Management:    Induction: Intravenous  PONV Risk Score and Plan: Ondansetron, Dexamethasone, Midazolam and Treatment may vary due to age or medical condition  Airway Management Planned: Oral ETT  Additional Equipment:   Intra-op Plan:   Post-operative Plan: Extubation in OR  Informed Consent: I have reviewed the patients History and Physical, chart, labs and discussed the procedure including the risks, benefits and alternatives for the proposed anesthesia with the patient or authorized representative who has indicated his/her  understanding and acceptance.     Dental Advisory Given  Plan Discussed with: Anesthesiologist, CRNA and Surgeon  Anesthesia Plan Comments: (Patient consented for risks of anesthesia including but not limited to:  - adverse reactions to medications - damage to eyes, teeth, lips or other oral mucosa - nerve damage due to positioning  - sore throat or hoarseness - Damage to heart, brain, nerves, lungs, other parts of body or loss of life  Patient voiced understanding.)        Anesthesia Quick Evaluation

## 2020-07-24 NOTE — Interval H&P Note (Signed)
History and Physical Interval Note:  07/24/2020 6:55 AM  Betty Poole  has presented today for surgery, with the diagnosis of K43.9 Ventral hernia without obstruction or gangrene.  The various methods of treatment have been discussed with the patient and family. After consideration of risks, benefits and other options for treatment, the patient has consented to  Procedure(s): XI ROBOTIC [place] (N/A) as a surgical intervention.  The patient's history has been reviewed, patient examined, no change in status, stable for surgery.  I have reviewed the patient's chart and labs.  Questions were answered to the patient's satisfaction.     Herbert Pun

## 2020-07-24 NOTE — Anesthesia Procedure Notes (Signed)
Procedure Name: Intubation Date/Time: 07/24/2020 7:42 AM Performed by: Allean Found, CRNA Pre-anesthesia Checklist: Patient identified, Patient being monitored, Timeout performed, Emergency Drugs available and Suction available Patient Re-evaluated:Patient Re-evaluated prior to induction Oxygen Delivery Method: Circle system utilized Preoxygenation: Pre-oxygenation with 100% oxygen Induction Type: IV induction Ventilation: Mask ventilation without difficulty Laryngoscope Size: 3 and McGraph Grade View: Grade I Tube type: Oral Tube size: 7.0 mm Number of attempts: 1 Airway Equipment and Method: Stylet Placement Confirmation: ETT inserted through vocal cords under direct vision,  positive ETCO2 and breath sounds checked- equal and bilateral Secured at: 21 cm Tube secured with: Tape Dental Injury: Teeth and Oropharynx as per pre-operative assessment

## 2020-07-24 NOTE — Op Note (Signed)
Preoperative diagnosis: Epigastric Hernia  Postoperative diagnosis: Epigastric Hernia  Procedure: Robotic assisted laparoscopic epigastric hernia repair with mesh  Anesthesia: General  Surgeon: Dr. Windell Moment  Wound Classification: Clean  Specimen: None  Complications: None  Estimated Blood Loss: 19ml  Indications:  Findings: 1. 2 cm epigastric hernia with incarcerated omentum 4. Tension free repair achieved with 11.4 cm bard mesh and suture 5. Adequate hemostasis  Description of procedure: The patient was brought to the operating room and general anesthesia was induced. A time-out was completed verifying correct patient, procedure, site, positioning, and implant(s) and/or special equipment prior to beginning this procedure. Antibiotics were administered prior to making the incision. SCDs placed. The anterior abdominal wall was prepped and draped in the standard sterile fashion.   Palmer's point chosen for entry.  Veress needle placed and abdomen insufflated to 15cm without any dramatic increase in pressure.  Needle removed and optiview technique used to place 84mm port at left lower quadrant.  No injury noted during placement. Exparel was infused in a TAP block. Two additional ports, 56mm x2 along lower abdomen.  Xi robot then docked into place.  Hernia contents noted and reduced with combination of blunt, sharp dissection with scissors and fenestrated forceps.  Hemostasis achieved throughout this portion.  Once all hernia contents reduced, there was noted to be a 2 cm hernia.    Insufflation dropped to 71mm and transfacial suture with 0 stratafix used to primarily close defect under minimal tension. Bard echo plus protected 11.4 cm mesh was placed within the abdominal cavity secured to the abdominal wall centered over the defect using the 0 stratafix previously used to primarily close defect.  The mesh was then circumferentially sutured into the anterior abdominal wall using 2-0 VLock  x2.  Any bleeding noted during this portion was no longer actively bleeding by end of securing mesh and tightening the suture.    Robot was undocked.  Abdomen then desufflated while camera within abdomen to ensure no signs of new bleed prior to removing camera and rest of ports completely.  All skin incisions closed with runninrg 4-0 Monocryl in a subcuticular fashion.  All wounds then dressed with Dermabond.  Patient was then successfully awakened and transferred to PACU in stable condition.  At the end of the procedure sponge and instrument counts were correct.

## 2020-07-24 NOTE — Anesthesia Postprocedure Evaluation (Signed)
Anesthesia Post Note  Patient: Betty Poole  Procedure(s) Performed: XI ROBOTIC ASSISTED VENTRAL HERNIA (N/A Abdomen)  Anesthesia Type: General   No complications documented.   Last Vitals:  Vitals:   07/24/20 0945 07/24/20 1000  BP: (!) 115/56 110/68  Pulse: 68 77  Resp: 16 18  Temp:    SpO2: 92% 93%    Last Pain:  Vitals:   07/24/20 0945  TempSrc:   PainSc: Asleep                 Precious Haws Ashley Bultema

## 2020-07-24 NOTE — Transfer of Care (Signed)
Immediate Anesthesia Transfer of Care Note  Patient: Betty Poole  Procedure(s) Performed: XI ROBOTIC ASSISTED VENTRAL HERNIA (N/A Abdomen)  Patient Location: PACU  Anesthesia Type:General  Level of Consciousness: awake and oriented  Airway & Oxygen Therapy: Patient Spontanous Breathing and Patient connected to face mask oxygen  Post-op Assessment: Report given to RN and Post -op Vital signs reviewed and stable  Post vital signs: Reviewed and stable  Last Vitals:  Vitals Value Taken Time  BP 140/76 07/24/20 0915  Temp 36 C 07/24/20 0915  Pulse 69 07/24/20 0918  Resp 15 07/24/20 0918  SpO2 92 % 07/24/20 0918  Vitals shown include unvalidated device data.  Last Pain:  Vitals:   07/24/20 0634  TempSrc: Tympanic  PainSc: 0-No pain         Complications: No complications documented.

## 2020-07-25 ENCOUNTER — Encounter: Payer: Self-pay | Admitting: General Surgery

## 2020-07-27 ENCOUNTER — Encounter: Payer: Self-pay | Admitting: General Surgery

## 2020-08-05 ENCOUNTER — Ambulatory Visit: Payer: Managed Care, Other (non HMO) | Admitting: Dermatology

## 2020-09-24 ENCOUNTER — Other Ambulatory Visit: Payer: Self-pay

## 2020-09-24 ENCOUNTER — Ambulatory Visit (INDEPENDENT_AMBULATORY_CARE_PROVIDER_SITE_OTHER): Payer: Managed Care, Other (non HMO) | Admitting: Dermatology

## 2020-09-24 DIAGNOSIS — D485 Neoplasm of uncertain behavior of skin: Secondary | ICD-10-CM | POA: Diagnosis not present

## 2020-09-24 DIAGNOSIS — Z1283 Encounter for screening for malignant neoplasm of skin: Secondary | ICD-10-CM | POA: Diagnosis not present

## 2020-09-24 DIAGNOSIS — L821 Other seborrheic keratosis: Secondary | ICD-10-CM

## 2020-09-24 DIAGNOSIS — S80862A Insect bite (nonvenomous), left lower leg, initial encounter: Secondary | ICD-10-CM

## 2020-09-24 DIAGNOSIS — L719 Rosacea, unspecified: Secondary | ICD-10-CM | POA: Diagnosis not present

## 2020-09-24 DIAGNOSIS — S80861A Insect bite (nonvenomous), right lower leg, initial encounter: Secondary | ICD-10-CM

## 2020-09-24 DIAGNOSIS — W57XXXA Bitten or stung by nonvenomous insect and other nonvenomous arthropods, initial encounter: Secondary | ICD-10-CM

## 2020-09-24 DIAGNOSIS — L814 Other melanin hyperpigmentation: Secondary | ICD-10-CM

## 2020-09-24 DIAGNOSIS — L578 Other skin changes due to chronic exposure to nonionizing radiation: Secondary | ICD-10-CM

## 2020-09-24 DIAGNOSIS — D229 Melanocytic nevi, unspecified: Secondary | ICD-10-CM

## 2020-09-24 DIAGNOSIS — D2362 Other benign neoplasm of skin of left upper limb, including shoulder: Secondary | ICD-10-CM

## 2020-09-24 DIAGNOSIS — D239 Other benign neoplasm of skin, unspecified: Secondary | ICD-10-CM

## 2020-09-24 DIAGNOSIS — D1800 Hemangioma unspecified site: Secondary | ICD-10-CM

## 2020-09-24 MED ORDER — TRIAMCINOLONE ACETONIDE 0.1 % EX OINT: TOPICAL_OINTMENT | CUTANEOUS | 1 refills | Status: AC

## 2020-09-24 MED ORDER — MUPIROCIN 2 % EX OINT: 1.0000 "application " | TOPICAL_OINTMENT | Freq: Every day | CUTANEOUS | 0 refills | Status: AC

## 2020-09-24 NOTE — Progress Notes (Addendum)
New Patient Visit  Subjective  Betty Poole is a 49 y.o. female who presents for the following: Skin Cancer Screening.  Patient here for full body skin exam and skin cancer screening. Patient does have some spots on her back that are bigger and some skin tags inner thighs. One spot at the right medial thigh has been present for a long time and is tender/often irritated. She is not aware of any changes.   No personal or family history of skin cancer. History of tanning bed use.    The following portions of the chart were reviewed this encounter and updated as appropriate:  Tobacco  Allergies  Meds  Problems  Med Hx  Surg Hx  Fam Hx      Review of Systems:  No other skin or systemic complaints except as noted in HPI or Assessment and Plan.  Objective  Well appearing patient in no apparent distress; mood and affect are within normal limits.  A full examination was performed including scalp, head, eyes, ears, nose, lips, neck, chest, axillae, abdomen, back, buttocks, bilateral upper extremities, bilateral lower extremities, hands, feet, fingers, toes, fingernails, and toenails. All findings within normal limits unless otherwise noted below.  Objective  Left Upper Arm: Firm pink/brown papulenodule with dimple sign.   Objective  Face: Mid face erythema over nose, cheeks and chin with rare inflammatory papule  Objective  Lower legs: Bite reaction  Objective  Left Medial Thigh: 0.7cm erythematous soft papule   Assessment & Plan  Dermatofibroma Left Upper Arm  Benign-appearing.  Observation.  Call clinic for new or changing lesions.  Recommend daily use of broad spectrum spf 30+ sunscreen to sun-exposed areas.    Rosacea Face  Not bothersome for patient. Recommend she be evaluated by ophthalmology if she develops grittiness or irritation of the eyes.   Insect bite, unspecified site, initial encounter Lower legs  Start TMC 0.1% ointment twice daily as  needed for itch/bug bites. Avoid applying to face, groin, and axilla. Use as directed. Risk of skin atrophy with long-term use reviewed.   Topical steroids (such as triamcinolone, fluocinolone, fluocinonide, mometasone, clobetasol, halobetasol, betamethasone, hydrocortisone) can cause thinning and lightening of the skin if they are used for too long in the same area. Your physician has selected the right strength medicine for your problem and area affected on the body. Please use your medication only as directed by your physician to prevent side effects.    Ordered Medications: triamcinolone ointment (KENALOG) 0.1 %  Neoplasm of uncertain behavior of skin Left Medial Thigh  Epidermal / dermal shaving  Lesion diameter (cm):  0.7 Informed consent: discussed and consent obtained   Timeout: patient name, date of birth, surgical site, and procedure verified   Patient was prepped and draped in usual sterile fashion: area prepped with isopropyl alcohol. Anesthesia: the lesion was anesthetized in a standard fashion   Anesthetic:  1% lidocaine w/ epinephrine 1-100,000 buffered w/ 8.4% NaHCO3 Instrument used: flexible razor blade   Hemostasis achieved with: aluminum chloride   Outcome: patient tolerated procedure well   Post-procedure details: wound care instructions given   Additional details:  Mupirocin and a bandage applied  Specimen 1 - Surgical pathology Differential Diagnosis: Irritated Skin Tag vs Neurofibroma vs Nevus Check Margins: No 0.7cm erythematous soft papule  Ordered Medications: mupirocin ointment (BACTROBAN) 2 %   Lentigines - Scattered tan macules - Discussed due to sun exposure - Benign, observe - Call for any changes  Seborrheic Keratoses - Stuck-on,  waxy, tan-brown papules and plaques  - Discussed benign etiology and prognosis. - Observe - Call for any changes  Melanocytic Nevi - Tan-brown and/or pink-flesh-colored symmetric macules and papules - Benign  appearing on exam today - Observation - Call clinic for new or changing moles - Recommend daily use of broad spectrum spf 30+ sunscreen to sun-exposed areas.   Hemangiomas - Red papules - Discussed benign nature - Observe - Call for any changes  Actinic Damage - diffuse scaly erythematous macules with underlying dyspigmentation - Recommend daily broad spectrum sunscreen SPF 30+ to sun-exposed areas, reapply every 2 hours as needed.  - Call for new or changing lesions.  Skin cancer screening performed today.  Return in about 1 year (around 09/24/2021) for TBSE.  Graciella Belton, RMA, am acting as scribe for Forest Gleason, MD .  Documentation: I have reviewed the above documentation for accuracy and completeness, and I agree with the above.  Forest Gleason, MD

## 2020-09-24 NOTE — Patient Instructions (Addendum)
Melanoma ABCDEs  Melanoma is the most dangerous type of skin cancer, and is the leading cause of death from skin disease.  You are more likely to develop melanoma if you:  Have light-colored skin, light-colored eyes, or red or blond hair  Spend a lot of time in the sun  Tan regularly, either outdoors or in a tanning bed  Have had blistering sunburns, especially during childhood  Have a close family member who has had a melanoma  Have atypical moles or large birthmarks  Early detection of melanoma is key since treatment is typically straightforward and cure rates are extremely high if we catch it early.   The first sign of melanoma is often a change in a mole or a new dark spot.  The ABCDE system is a way of remembering the signs of melanoma.  A for asymmetry:  The two halves do not match. B for border:  The edges of the growth are irregular. C for color:  A mixture of colors are present instead of an even brown color. D for diameter:  Melanomas are usually (but not always) greater than 64mm - the size of a pencil eraser. E for evolution:  The spot keeps changing in size, shape, and color.  Please check your skin once per month between visits. You can use a small mirror in front and a large mirror behind you to keep an eye on the back side or your body.   If you see any new or changing lesions before your next follow-up, please call to schedule a visit.  Please continue daily skin protection including broad spectrum sunscreen SPF 30+ to sun-exposed areas, reapplying every 2 hours as needed when you're outdoors.   Recommend taking Heliocare sun protection supplement daily in sunny weather for additional sun protection. For maximum protection on the sunniest days, you can take up to 2 capsules of regular Heliocare OR take 1 capsule of Heliocare Ultra. For prolonged exposure (such as a full day in the sun), you can repeat your dose of the supplement 4 hours after your first dose. Heliocare  can be purchased at [facility] or at [email or web address].   Topical steroids (such as triamcinolone, fluocinolone, fluocinonide, mometasone, clobetasol, halobetasol, betamethasone, hydrocortisone) can cause thinning and lightening of the skin if they are used for too long in the same area. Your physician has selected the right strength medicine for your problem and area affected on the body. Please use your medication only as directed by your physician to prevent side effects.   Wound Care Instructions  1. Cleanse wound gently with soap and water once a day then pat dry with clean gauze. Apply a thing coat of Petrolatum (petroleum jelly, "Vaseline") over the wound (unless you have an allergy to this). We recommend that you use a new, sterile tube of Vaseline. Do not pick or remove scabs. Do not remove the yellow or white "healing tissue" from the base of the wound.  2. Cover the wound with fresh, clean, nonstick gauze and secure with paper tape. You may use Band-Aids in place of gauze and tape if the would is small enough, but would recommend trimming much of the tape off as there is often too much. Sometimes Band-Aids can irritate the skin.  3. You should call the office for your biopsy report after 1 week if you have not already been contacted.  4. If you experience any problems, such as abnormal amounts of bleeding, swelling, significant bruising, significant pain, or  evidence of infection, please call the office immediately.  5. FOR ADULT SURGERY PATIENTS: If you need something for pain relief you may take 1 extra strength Tylenol (acetaminophen) AND 2 Ibuprofen (200mg  each) together every 4 hours as needed for pain. (do not take these if you are allergic to them or if you have a reason you should not take them.) Typically, you may only need pain medication for 1 to 3 days.

## 2020-09-29 NOTE — Progress Notes (Signed)
Skin , left medial thigh FIBROEPITHELIAL POLYP "benign growth.  No additional treatment needed."

## 2020-10-18 ENCOUNTER — Encounter: Payer: Self-pay | Admitting: Dermatology

## 2021-07-07 ENCOUNTER — Other Ambulatory Visit: Payer: Self-pay

## 2021-07-07 DIAGNOSIS — Z1231 Encounter for screening mammogram for malignant neoplasm of breast: Secondary | ICD-10-CM

## 2021-07-12 ENCOUNTER — Ambulatory Visit
Admission: RE | Admit: 2021-07-12 | Discharge: 2021-07-12 | Disposition: A | Discharge status: Home / Self Care | Payer: Managed Care, Other (non HMO) | Source: Ambulatory Visit

## 2021-07-12 ENCOUNTER — Other Ambulatory Visit: Payer: Self-pay

## 2021-07-12 DIAGNOSIS — Z1231 Encounter for screening mammogram for malignant neoplasm of breast: Secondary | ICD-10-CM | POA: Diagnosis present

## 2022-12-21 IMAGING — MG MM DIGITAL SCREENING BILAT W/ TOMO AND CAD
8 series · 8 of 24 positions shown · non-contrast
Comparison: Previous exam(s).

CLINICAL DATA: Screening.

EXAM:
DIGITAL SCREENING BILATERAL MAMMOGRAM WITH TOMOSYNTHESIS AND CAD
TECHNIQUE: Bilateral screening digital craniocaudal and mediolateral oblique
mammograms were obtained. Bilateral screening digital breast
tomosynthesis was performed. The images were evaluated with
computer-aided detection.

[R CC synth-2D]
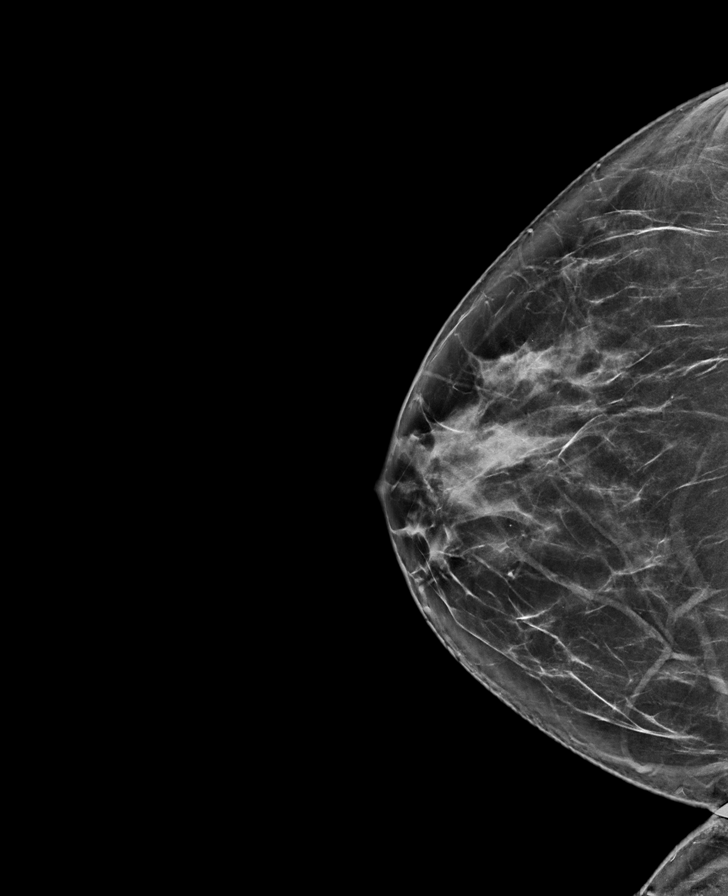

[R MLO synth-2D]
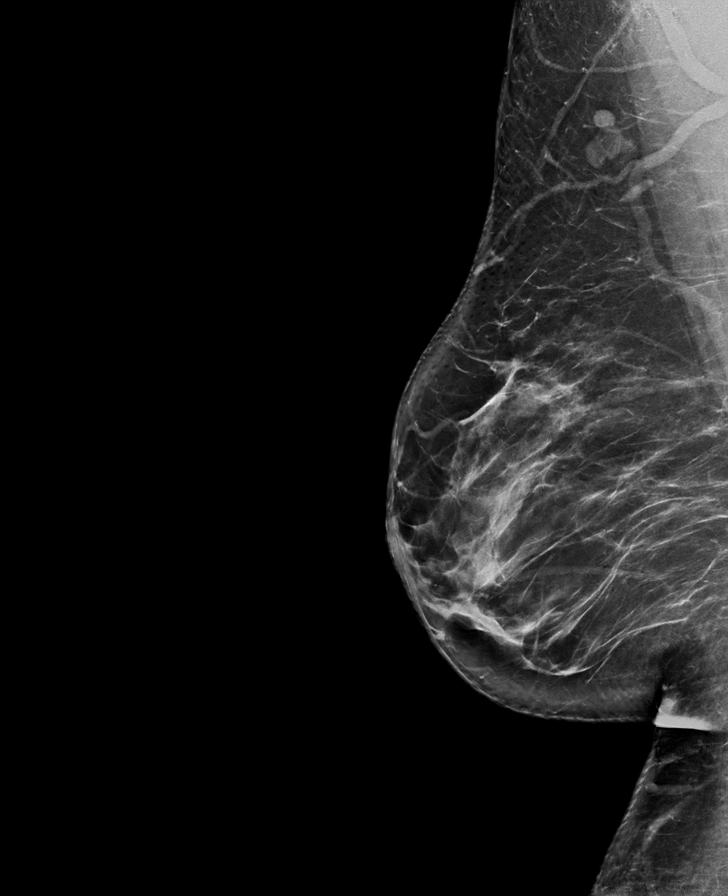

[L MLO synth-2D]
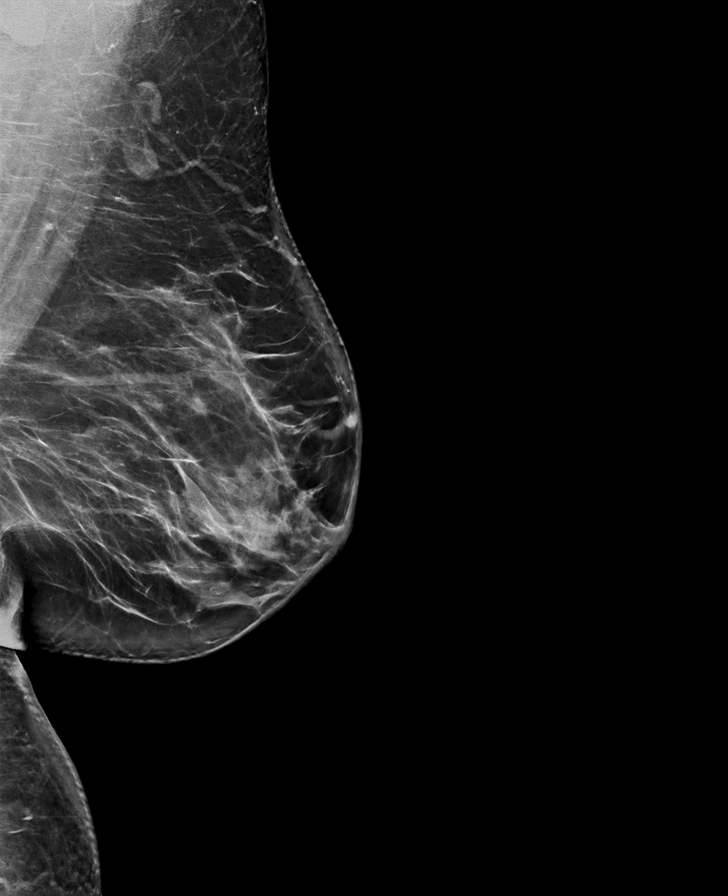

[L CC synth-2D]
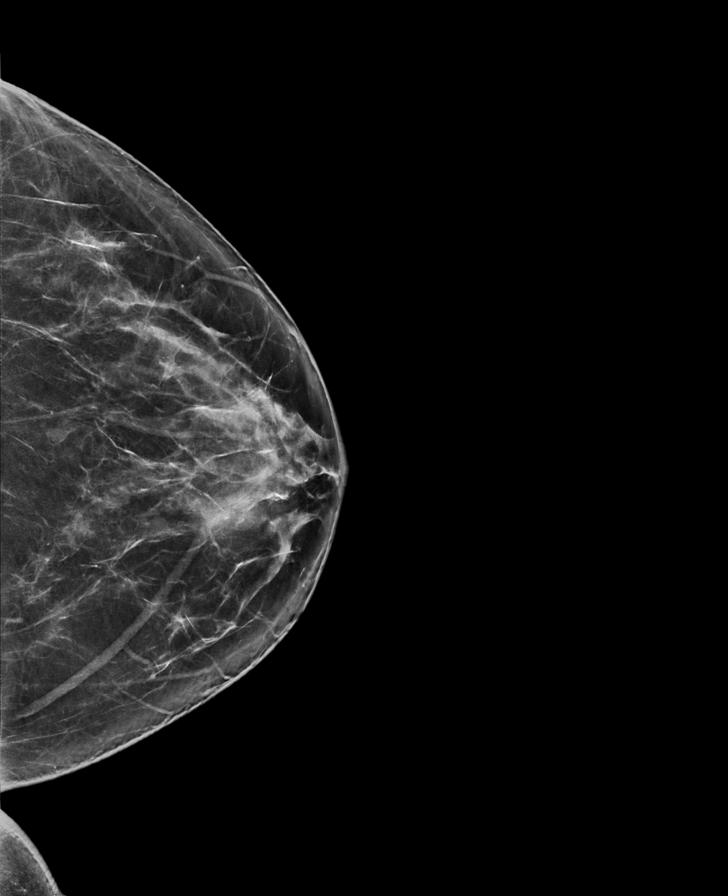

[R MLO tomo · tomo slice 46/91.0]
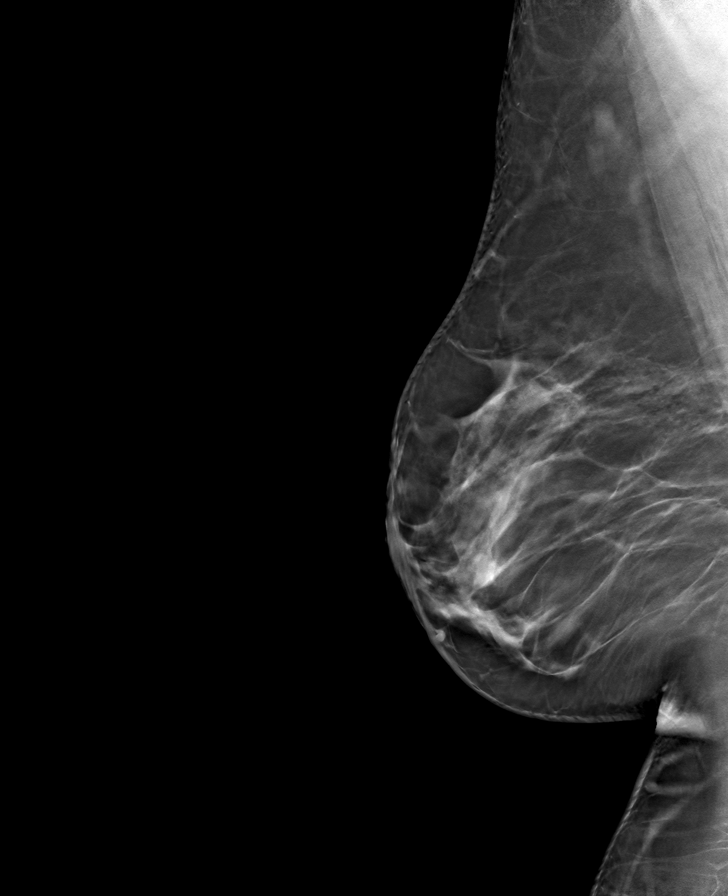

[R CC tomo · tomo slice 39/78.0]
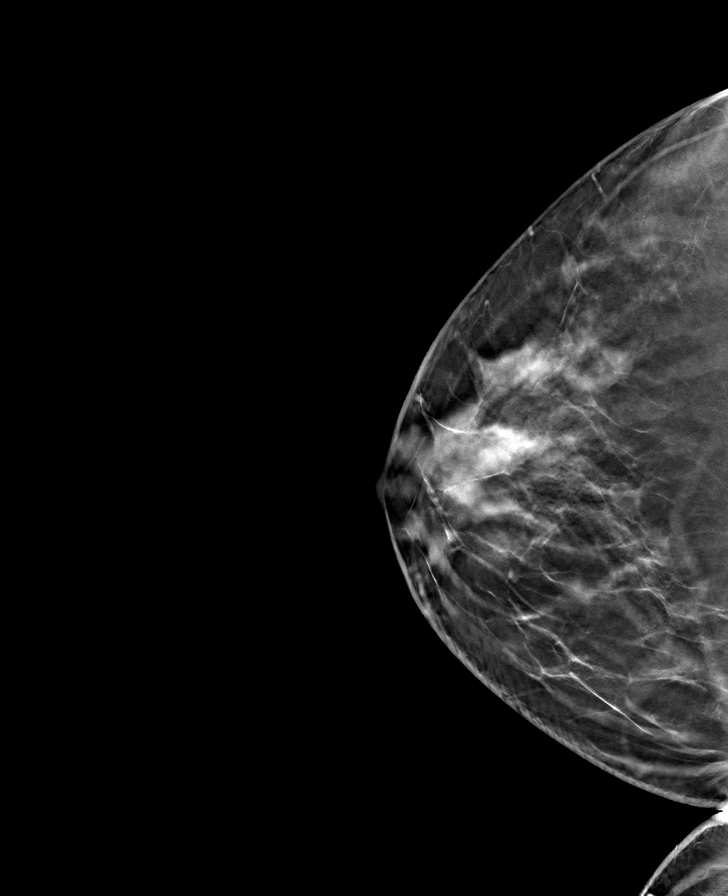

[L MLO tomo · tomo slice 47/94.0]
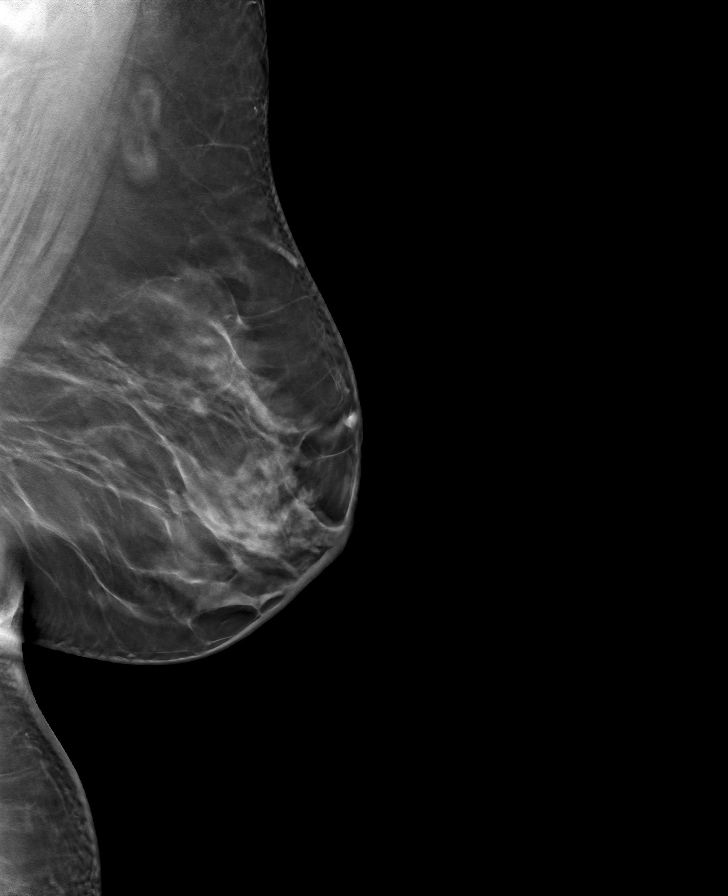

[L CC tomo · tomo slice 39/77.0]
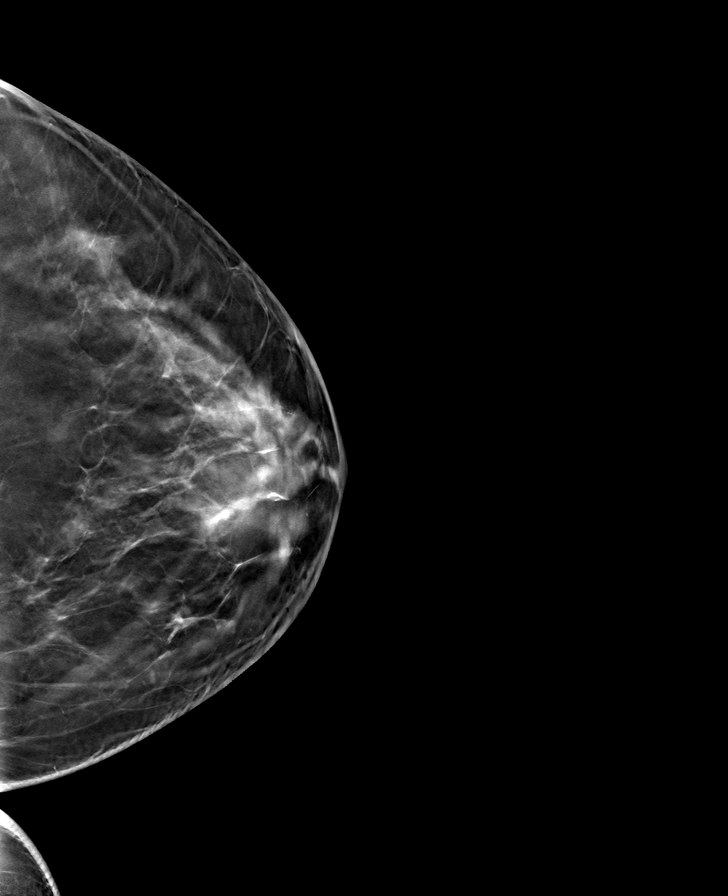

[8 of 24 positions shown; findings below may reference images not displayed]

ACR Breast Density Category c: The breast tissue is heterogeneously
dense, which may obscure small masses.
FINDINGS: There are no findings suspicious for malignancy.
IMPRESSION: No mammographic evidence of malignancy. A result letter of this
screening mammogram will be mailed directly to the patient.

RECOMMENDATION:
Screening mammogram in one year. (Code:Q3-W-BC3)

BI-RADS CATEGORY  1: Negative.

## 2024-09-24 ENCOUNTER — Other Ambulatory Visit: Payer: Self-pay | Admitting: Infectious Diseases

## 2024-09-24 DIAGNOSIS — Z23 Encounter for immunization: Secondary | ICD-10-CM

## 2024-09-24 DIAGNOSIS — E785 Hyperlipidemia, unspecified: Secondary | ICD-10-CM

## 2024-09-24 DIAGNOSIS — E119 Type 2 diabetes mellitus without complications: Secondary | ICD-10-CM

## 2024-10-06 LAB — COLOGUARD: COLOGUARD: NEGATIVE
